# Patient Record
Sex: Male | Born: 1970 | Race: White | Hispanic: No | Marital: Single | Smoking: Former smoker
Health system: Southern US, Community
[De-identification: ages and names within clinical notes are randomized; demographics above are authoritative.]

## PROBLEM LIST (undated history)

## (undated) DIAGNOSIS — T4145XA Adverse effect of unspecified anesthetic, initial encounter: Secondary | ICD-10-CM

## (undated) DIAGNOSIS — T8859XA Other complications of anesthesia, initial encounter: Secondary | ICD-10-CM

## (undated) DIAGNOSIS — M1990 Unspecified osteoarthritis, unspecified site: Secondary | ICD-10-CM

## (undated) DIAGNOSIS — J45909 Unspecified asthma, uncomplicated: Secondary | ICD-10-CM

## (undated) DIAGNOSIS — D649 Anemia, unspecified: Secondary | ICD-10-CM

## (undated) DIAGNOSIS — K219 Gastro-esophageal reflux disease without esophagitis: Secondary | ICD-10-CM

## (undated) DIAGNOSIS — F4310 Post-traumatic stress disorder, unspecified: Secondary | ICD-10-CM

## (undated) HISTORY — PX: KNEE SURGERY: SHX244

## (undated) HISTORY — PX: OTHER SURGICAL HISTORY: SHX169

---

## 1999-10-31 DIAGNOSIS — D649 Anemia, unspecified: Secondary | ICD-10-CM

## 1999-10-31 HISTORY — PX: COSMETIC SURGERY: SHX468

## 1999-10-31 HISTORY — DX: Anemia, unspecified: D64.9

## 2000-02-04 ENCOUNTER — Encounter: Payer: Self-pay | Admitting: Specialist

## 2000-02-04 ENCOUNTER — Inpatient Hospital Stay (HOSPITAL_COMMUNITY): Admission: EM | Admit: 2000-02-04 | Discharge: 2000-02-05 | Payer: Self-pay | Admitting: Emergency Medicine

## 2000-04-10 ENCOUNTER — Encounter: Payer: Self-pay | Admitting: Internal Medicine

## 2000-04-10 ENCOUNTER — Encounter: Admission: RE | Admit: 2000-04-10 | Discharge: 2000-04-10 | Payer: Self-pay | Admitting: Internal Medicine

## 2000-06-08 ENCOUNTER — Other Ambulatory Visit: Admission: RE | Admit: 2000-06-08 | Discharge: 2000-06-08 | Payer: Self-pay | Admitting: Orthopedic Surgery

## 2001-12-18 ENCOUNTER — Ambulatory Visit (HOSPITAL_BASED_OUTPATIENT_CLINIC_OR_DEPARTMENT_OTHER): Admission: RE | Admit: 2001-12-18 | Discharge: 2001-12-18 | Payer: Self-pay | Admitting: Orthopedic Surgery

## 2001-12-18 ENCOUNTER — Encounter (INDEPENDENT_AMBULATORY_CARE_PROVIDER_SITE_OTHER): Payer: Self-pay | Admitting: *Deleted

## 2002-05-03 ENCOUNTER — Emergency Department (HOSPITAL_COMMUNITY): Admission: EM | Admit: 2002-05-03 | Discharge: 2002-05-04 | Payer: Self-pay | Admitting: Emergency Medicine

## 2006-03-26 ENCOUNTER — Emergency Department (HOSPITAL_COMMUNITY): Admission: EM | Admit: 2006-03-26 | Discharge: 2006-03-26 | Payer: Self-pay | Admitting: Emergency Medicine

## 2007-02-21 ENCOUNTER — Encounter: Admission: RE | Admit: 2007-02-21 | Discharge: 2007-02-21 | Payer: Self-pay | Admitting: Family Medicine

## 2007-09-16 ENCOUNTER — Encounter: Admission: RE | Admit: 2007-09-16 | Discharge: 2007-09-16 | Payer: Self-pay | Admitting: Internal Medicine

## 2010-07-07 ENCOUNTER — Encounter: Admission: RE | Admit: 2010-07-07 | Discharge: 2010-07-07 | Payer: Self-pay | Admitting: Internal Medicine

## 2010-07-22 ENCOUNTER — Encounter: Admission: RE | Admit: 2010-07-22 | Discharge: 2010-07-22 | Payer: Self-pay | Admitting: Gastroenterology
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
## 2011-03-17 NOTE — Op Note (Signed)
[place]. [facility]  Patient:    Kenneth Lara, Kenneth Lara Visit Number: [id number] MRN: [id number]          Service Type: Attending:  Artist Pais. Mina Marble, M.D. Dictated by:   Artist Pais Mina Marble, M.D. Proc. Date: 12/18/01                             Operative Report  PREOPERATIVE DIAGNOSIS: Recurrent left wrist dorsal ganglion cyst.  POSTOPERATIVE DIAGNOSIS: Recurrent left wrist dorsal ganglion cyst.  OPERATION/PROCEDURE: Excision of recurrent cyst of left wrist and posterior interosseous nerve neurectomy.  SURGEON: Artist Pais. Mina Marble, M.D.  ASSISTANT: Junius Roads. Ireton, P.A.C.  ANESTHESIA: General.  TOURNIQUET TIME: Twenty-eight minutes.  COMPLICATIONS: None.  DRAINS: None.  SPECIMENS: Two sent.  DESCRIPTION OF PROCEDURE: The patient was taken to the operating room and after the induction of adequate general anesthesia the left upper extremity was prepped and draped in the usual sterile fashion.  An Esmarch was used to exsanguinate the limb and the tourniquet then inflated to 250 mm Hg.  At this point in time a 3 cm transverse incision was made dorsally over the left wrist using his old scar.  The incision was taken down through the skin and subcutaneous tissue, with care to carefully identify and retract dorsal veins and branches of the superficial radial nerve.  At this point in time the interval between the third and fourth dorsal compartments was identified, and a recurrent cyst approximately 1 x 1.5 cm in diameter was dissected down to the dorsal capsule, and a small amount of the capsule as well as the cyst were excised in entirety.  At this point in time, proximal retraction of the skin flaps was undertaken, and the fourth dorsal compartment was carefully dissected down to the base, where the posterior interosseous nerve was identified.  It was transected distally, freed up proximally, and then resected and allowed to drop back into the  proximal wound.  The wound was then thoroughly irrigated.  Hemostasis was achieved with bipolar cautery. The dorsal capsule was repaired in pants-over-vest fashion using 4-0 Vicryl x1 horizontal mattress sutures, followed by closure of the skin with running 3-0 Prolene subcuticular stitch.  Steri-Strips, 4 x 4s, fluffs, and compressive dressing were applied, as well as a volar splint. The patient tolerated the procedure well and went to the recovery room in stable fashion. Dictated by:   Artist Pais Mina Marble, M.D. Attending:  Artist Pais Mina Marble, M.D. DD:  12/18/01 TD:  12/18/01 Job: [id number]

## 2013-09-05 ENCOUNTER — Other Ambulatory Visit: Payer: Self-pay | Admitting: Geriatric Medicine

## 2013-09-05 ENCOUNTER — Ambulatory Visit
Admission: RE | Admit: 2013-09-05 | Discharge: 2013-09-05 | Disposition: A | Discharge status: Home / Self Care | Payer: BC Managed Care – PPO | Source: Ambulatory Visit | Attending: Geriatric Medicine | Admitting: Geriatric Medicine

## 2013-09-05 DIAGNOSIS — J40 Bronchitis, not specified as acute or chronic: Secondary | ICD-10-CM

## 2014-03-30 HISTORY — PX: BICEPS TENDON REPAIR: SHX566

## 2014-06-04 ENCOUNTER — Other Ambulatory Visit: Payer: Self-pay | Admitting: Gastroenterology

## 2014-08-03 ENCOUNTER — Other Ambulatory Visit: Payer: Self-pay | Admitting: Gastroenterology

## 2014-08-04 ENCOUNTER — Encounter (HOSPITAL_COMMUNITY): Payer: Self-pay | Admitting: Pharmacy Technician

## 2014-08-05 ENCOUNTER — Encounter (HOSPITAL_COMMUNITY): Payer: Self-pay | Admitting: *Deleted

## 2014-08-24 ENCOUNTER — Ambulatory Visit (HOSPITAL_COMMUNITY): Payer: BC Managed Care – PPO | Admitting: Anesthesiology

## 2014-08-24 ENCOUNTER — Encounter (HOSPITAL_COMMUNITY): Payer: Self-pay | Admitting: *Deleted

## 2014-08-24 ENCOUNTER — Encounter (HOSPITAL_COMMUNITY)
Admission: RE | Disposition: A | Discharge status: Home / Self Care | Payer: Self-pay | Source: Ambulatory Visit | Attending: Gastroenterology

## 2014-08-24 ENCOUNTER — Ambulatory Visit (HOSPITAL_COMMUNITY)
Admission: RE | Admit: 2014-08-24 | Discharge: 2014-08-24 | Disposition: A | Discharge status: Home / Self Care | Payer: BC Managed Care – PPO | Source: Ambulatory Visit | Attending: Gastroenterology | Admitting: Gastroenterology

## 2014-08-24 ENCOUNTER — Encounter (HOSPITAL_COMMUNITY): Payer: BC Managed Care – PPO | Admitting: Anesthesiology

## 2014-08-24 DIAGNOSIS — D125 Benign neoplasm of sigmoid colon: Secondary | ICD-10-CM | POA: Insufficient documentation

## 2014-08-24 DIAGNOSIS — F329 Major depressive disorder, single episode, unspecified: Secondary | ICD-10-CM | POA: Diagnosis not present

## 2014-08-24 DIAGNOSIS — Z800 Family history of malignant neoplasm of digestive organs: Secondary | ICD-10-CM | POA: Diagnosis not present

## 2014-08-24 DIAGNOSIS — Z86010 Personal history of colonic polyps: Secondary | ICD-10-CM | POA: Diagnosis not present

## 2014-08-24 DIAGNOSIS — Z882 Allergy status to sulfonamides status: Secondary | ICD-10-CM | POA: Diagnosis not present

## 2014-08-24 DIAGNOSIS — F4310 Post-traumatic stress disorder, unspecified: Secondary | ICD-10-CM | POA: Insufficient documentation

## 2014-08-24 DIAGNOSIS — Z1211 Encounter for screening for malignant neoplasm of colon: Secondary | ICD-10-CM | POA: Diagnosis present

## 2014-08-24 DIAGNOSIS — J309 Allergic rhinitis, unspecified: Secondary | ICD-10-CM | POA: Insufficient documentation

## 2014-08-24 HISTORY — DX: Gastro-esophageal reflux disease without esophagitis: K21.9

## 2014-08-24 HISTORY — DX: Anemia, unspecified: D64.9

## 2014-08-24 HISTORY — DX: Unspecified asthma, uncomplicated: J45.909

## 2014-08-24 HISTORY — DX: Post-traumatic stress disorder, unspecified: F43.10

## 2014-08-24 HISTORY — PX: COLONOSCOPY WITH PROPOFOL: SHX5780

## 2014-08-24 HISTORY — DX: Unspecified osteoarthritis, unspecified site: M19.90

## 2014-08-24 HISTORY — DX: Other complications of anesthesia, initial encounter: T88.59XA

## 2014-08-24 HISTORY — DX: Adverse effect of unspecified anesthetic, initial encounter: T41.45XA

## 2014-08-24 SURGERY — COLONOSCOPY WITH PROPOFOL
Anesthesia: Monitor Anesthesia Care

## 2014-08-24 MED ORDER — SODIUM CHLORIDE 0.9 % IV SOLN: INTRAVENOUS | Status: DC

## 2014-08-24 MED ORDER — LIDOCAINE HCL (CARDIAC) 20 MG/ML IV SOLN
INTRAVENOUS | Status: AC
  Filled 2014-08-24: qty 5

## 2014-08-24 MED ORDER — LIDOCAINE HCL (CARDIAC) 20 MG/ML IV SOLN
INTRAVENOUS | Status: DC | PRN
  Administered 2014-08-24 (×2): 50 mg via INTRAVENOUS

## 2014-08-24 MED ORDER — LACTATED RINGERS IV SOLN
INTRAVENOUS | Status: DC
Start: 2014-08-24 — End: 2014-08-24
  Administered 2014-08-24: 500 mL via INTRAVENOUS

## 2014-08-24 MED ORDER — PROPOFOL 10 MG/ML IV BOLUS
INTRAVENOUS | Status: AC
  Filled 2014-08-24: qty 20

## 2014-08-24 MED ORDER — PROPOFOL INFUSION 10 MG/ML OPTIME
INTRAVENOUS | Status: DC | PRN
Start: 2014-08-24 — End: 2014-08-24
  Administered 2014-08-24: 140 ug/kg/min via INTRAVENOUS

## 2014-08-24 MED ORDER — PROPOFOL 10 MG/ML IV BOLUS
INTRAVENOUS | Status: DC | PRN
  Administered 2014-08-24: 100 mg via INTRAVENOUS
  Administered 2014-08-24: 50 mg via INTRAVENOUS

## 2014-08-24 SURGICAL SUPPLY — 22 items

## 2014-08-24 NOTE — Anesthesia Preprocedure Evaluation (Signed)
Anesthesia Evaluation  Patient identified by MRN, date of birth, ID band Patient awake    Reviewed: Allergy & Precautions, H&P , NPO status , Patient's Chart, lab work & pertinent test results  History of Anesthesia Complications (+) history of anesthetic complications  Airway Mallampati: II TM Distance: >3 FB Neck ROM: Full    Dental no notable dental hx.    Pulmonary asthma ,  breath sounds clear to auscultation  Pulmonary exam normal       Cardiovascular negative cardio ROS  Rhythm:Regular Rate:Normal     Neuro/Psych PSYCHIATRIC DISORDERS negative neurological ROS     GI/Hepatic Neg liver ROS, GERD-  ,  Endo/Other  negative endocrine ROS  Renal/GU negative Renal ROS     Musculoskeletal  (+) Arthritis -,   Abdominal   Peds  Hematology  (+) anemia ,   Anesthesia Other Findings   Reproductive/Obstetrics negative OB ROS                           Anesthesia Physical Anesthesia Plan  ASA: II  Anesthesia Plan: MAC   Post-op Pain Management:    Induction: Intravenous  Airway Management Planned:   Additional Equipment:   Intra-op Plan:   Post-operative Plan:   Informed Consent: I have reviewed the patients History and Physical, chart, labs and discussed the procedure including the risks, benefits and alternatives for the proposed anesthesia with the patient or authorized representative who has indicated his/her understanding and acceptance.   Dental advisory given  Plan Discussed with: CRNA  Anesthesia Plan Comments:         Anesthesia Quick Evaluation

## 2014-08-24 NOTE — Discharge Instructions (Signed)

## 2014-08-24 NOTE — H&P (Signed)
  Procedure: Surveillance colonoscopy. Colonoscopy with removal of a 12 mm tubulovillous adenomatous descending colon polyp performed on 07/11/2010. The patient was inadequately sedated during his first colonoscopy with versed and fentanyl. Mother diagnosed with colon cancer in her mid 33s.  History: The patient is a 43 year old male born 1971-01-07. His mother was diagnosed with colon cancer in her mid 14s. He underwent a colonoscopy on 07/11/2010 with removal of a 12 mm tubulovillous adenomatous descending colon polyp. He is scheduled to undergo a surveillance colonoscopy today.  Medication allergies: Sulfa  Past medical history: Right shoulder surgery. Right knee surgery. Allergic rhinitis. Posttraumatic stress disorder. Depression. Personal history of tubulovillous adenomatous colon polyp removed colonoscopically. Allergic rhinitis.  Family history: Mother diagnosed with colon cancer in her mid 24s.  Exam: The patient is alert and lying comfortably on the endoscopy stretcher. Lungs are clear to auscultation. Cardiac exam reveals a regular rhythm. Abdomen is soft and nontender to palpation.  Plan: Proceed with surveillance colonoscopy using propofol sedation.

## 2014-08-24 NOTE — Transfer of Care (Signed)
Immediate Anesthesia Transfer of Care Note  Patient: Kenneth Lara  Procedure(s) Performed: Procedure(s): COLONOSCOPY WITH PROPOFOL (N/A)  Patient Location: PACU  Anesthesia Type:MAC  Level of Consciousness: awake, alert  and oriented  Airway & Oxygen Therapy: Patient Spontanous Breathing and Patient connected to face mask oxygen  Post-op Assessment: Report given to PACU RN and Post -op Vital signs reviewed and stable  Post vital signs: Reviewed and stable  Complications: No apparent anesthesia complications

## 2014-08-24 NOTE — Anesthesia Postprocedure Evaluation (Signed)
Anesthesia Post Note  Patient: Kenneth Lara  Procedure(s) Performed: Procedure(s) (LRB): COLONOSCOPY WITH PROPOFOL (N/A)  Anesthesia type: MAC  Patient location: PACU  Post pain: Pain level controlled  Post assessment: Post-op Vital signs reviewed  Last Vitals: BP 130/88  Pulse 45  Temp(Src) 36.5 C (Oral)  Resp 16  Ht 5\' 11"  (1.803 m)  Wt 177 lb (80.287 kg)  BMI 24.70 kg/m2  SpO2 100%  Post vital signs: Reviewed  Level of consciousness: awake  Complications: No apparent anesthesia complications

## 2014-08-24 NOTE — Op Note (Signed)
Procedure: Surveillance colonoscopy. Colonoscopy with removal of a 12 mm tubulovillous adenomatous descending colon polyp performed on 07/11/2010. Mother diagnosed with colon cancer in her mid 70s.  Endoscopist: Earle Gell  Premedication: Propofol administered by anesthesia  Procedure: The patient was placed in the left lateral decubitus position. Anal inspection and digital rectal exam were normal. The Pentax pediatric colonoscope was introduced into the rectum and advanced to the cecum. A normal-appearing appendiceal orifice was identified. A normal-appearing ileocecal valve was intubated and the terminal ileum inspected. Colonic preparation for the exam today was good.  Rectum. Normal. Retroflex view of the distal rectum normal  Sigmoid colon. A 5 mm sessile polyp was removed with the cold snare at 40 cm from the anal verge.  Descending colon. Normal  Splenic flexure. Normal  Transverse colon. Normal  Hepatic flexure. Normal  Ascending colon. Normal  Cecum and ileocecal valve. Normal  Terminal ileum. Normal  Assessment: A small sigmoid colon polyp was removed with the cold snare; otherwise normal surveillance colonoscopy  Recommendation: Schedule surveillance colonoscopy in 5 years

## 2014-08-25 ENCOUNTER — Encounter (HOSPITAL_COMMUNITY): Payer: Self-pay | Admitting: Gastroenterology

## 2017-02-09 ENCOUNTER — Other Ambulatory Visit: Payer: Self-pay | Admitting: Orthopedic Surgery

## 2017-02-09 DIAGNOSIS — M67912 Unspecified disorder of synovium and tendon, left shoulder: Secondary | ICD-10-CM

## 2017-02-16 ENCOUNTER — Other Ambulatory Visit: Payer: Self-pay

## 2017-02-16 ENCOUNTER — Ambulatory Visit
Admission: RE | Admit: 2017-02-16 | Discharge: 2017-02-16 | Disposition: A | Discharge status: Home / Self Care | Payer: BLUE CROSS/BLUE SHIELD | Source: Ambulatory Visit | Attending: Orthopedic Surgery | Admitting: Orthopedic Surgery

## 2017-02-16 DIAGNOSIS — M67912 Unspecified disorder of synovium and tendon, left shoulder: Secondary | ICD-10-CM

## 2017-02-24 ENCOUNTER — Other Ambulatory Visit: Payer: Self-pay

## 2017-03-07 ENCOUNTER — Ambulatory Visit (INDEPENDENT_AMBULATORY_CARE_PROVIDER_SITE_OTHER): Payer: BLUE CROSS/BLUE SHIELD | Admitting: Physician Assistant

## 2017-03-07 ENCOUNTER — Encounter: Payer: Self-pay | Admitting: Physician Assistant

## 2017-03-07 ENCOUNTER — Ambulatory Visit: Payer: Self-pay | Admitting: Family Medicine

## 2017-03-07 ENCOUNTER — Ambulatory Visit (INDEPENDENT_AMBULATORY_CARE_PROVIDER_SITE_OTHER): Payer: BLUE CROSS/BLUE SHIELD

## 2017-03-07 VITALS — BP 142/94 | HR 68 | Temp 98.9°F | Resp 18 | Ht 71.0 in | Wt 177.0 lb

## 2017-03-07 DIAGNOSIS — M25522 Pain in left elbow: Secondary | ICD-10-CM

## 2017-03-07 DIAGNOSIS — S51012A Laceration without foreign body of left elbow, initial encounter: Secondary | ICD-10-CM | POA: Diagnosis not present

## 2017-03-07 MED ORDER — HYDROCODONE-ACETAMINOPHEN 7.5-325 MG PO TABS: 1.0000 | ORAL_TABLET | Freq: Four times a day (QID) | ORAL | 0 refills | Status: AC | PRN
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
## 2017-03-07 NOTE — Patient Instructions (Addendum)
  WOUND CARE Please return in 7 days to have your stitches/staples removed or sooner if you have concerns. . Keep area clean and dry with dressing in place for 24 hours. . After 24 hours, remove bandage and wash wound gently with mild soap and warm water. When skin is dry, reapply a new bandage. Once wound is no longer draining, may leave wound open to air. . Continue daily cleansing with soap and water until stitches/staples are removed. . Do not apply any ointments or creams to the wound while stitches/staples are in place, as this may cause delayed healing. . Notify the office if you experience any of the following signs of infection: Swelling, redness, pus drainage, streaking, fever >101.0 F . Notify the office if you experience excessive bleeding that does not stop after 15-20 minutes of constant, firm pressure.  Thank you for coming in today. I hope you feel we met your needs.  Feel free to call [facility] if you have any questions or further requests.  Please consider signing up for MyChart if you do not already have it, as this is a great way to communicate with me.  Best,  Whitney McVey, PA-C    IF you received an x-ray today, you will receive an invoice from [facility]. Please contact [facility] at [phone] with questions or concerns regarding your invoice.   IF you received labwork today, you will receive an invoice from [facility]. Please contact [facility] at [phone] with questions or concerns regarding your invoice.   Our billing staff will not be able to assist you with questions regarding bills from these companies.  You will be contacted with the lab results as soon as they are available. The fastest way to get your results is to activate your My Chart account. Instructions are located on the last page of this paperwork. If you have not heard from us regarding the results in 2 weeks, please contact this office.      

## 2017-03-08 NOTE — Progress Notes (Signed)
Pt called in c/o throbbing left elbow pain s/p injury. Advised script is at front desk for pick up

## 2017-04-01 ENCOUNTER — Encounter (HOSPITAL_BASED_OUTPATIENT_CLINIC_OR_DEPARTMENT_OTHER): Payer: Self-pay

## 2017-04-01 ENCOUNTER — Emergency Department (HOSPITAL_BASED_OUTPATIENT_CLINIC_OR_DEPARTMENT_OTHER): Payer: BLUE CROSS/BLUE SHIELD

## 2017-04-01 ENCOUNTER — Emergency Department (HOSPITAL_BASED_OUTPATIENT_CLINIC_OR_DEPARTMENT_OTHER)
Admission: EM | Admit: 2017-04-01 | Discharge: 2017-04-01 | Disposition: A | Discharge status: Home / Self Care | Payer: BLUE CROSS/BLUE SHIELD | Attending: Emergency Medicine | Admitting: Emergency Medicine

## 2017-04-01 DIAGNOSIS — Z87891 Personal history of nicotine dependence: Secondary | ICD-10-CM | POA: Diagnosis not present

## 2017-04-01 DIAGNOSIS — R519 Headache, unspecified: Secondary | ICD-10-CM

## 2017-04-01 DIAGNOSIS — R51 Headache: Secondary | ICD-10-CM | POA: Diagnosis not present

## 2017-04-01 DIAGNOSIS — F419 Anxiety disorder, unspecified: Secondary | ICD-10-CM | POA: Diagnosis not present

## 2017-04-01 DIAGNOSIS — R079 Chest pain, unspecified: Secondary | ICD-10-CM | POA: Diagnosis present

## 2017-04-01 DIAGNOSIS — R0789 Other chest pain: Secondary | ICD-10-CM | POA: Insufficient documentation

## 2017-04-01 DIAGNOSIS — J45909 Unspecified asthma, uncomplicated: Secondary | ICD-10-CM | POA: Insufficient documentation

## 2017-04-01 LAB — COMPREHENSIVE METABOLIC PANEL
ALK PHOS: 42 U/L (ref 38–126)
ALT: 15 U/L — ABNORMAL LOW (ref 17–63)
ANION GAP: 8 (ref 5–15)
AST: 19 U/L (ref 15–41)
Albumin: 4.4 g/dL (ref 3.5–5.0)
BUN: 13 mg/dL (ref 6–20)
CALCIUM: 9.4 mg/dL (ref 8.9–10.3)
CO2: 26 mmol/L (ref 22–32)
Chloride: 102 mmol/L (ref 101–111)
Creatinine, Ser: 1.11 mg/dL (ref 0.61–1.24)
GFR calc Af Amer: >60 mL/min (ref >60)
GFR calc non Af Amer: >60 mL/min (ref >60)
Glucose, Bld: 101 mg/dL — ABNORMAL HIGH (ref 65–99)
Potassium: 3.9 mmol/L (ref 3.5–5.1)
SODIUM: 136 mmol/L (ref 135–145)
Total Bilirubin: 0.6 mg/dL (ref 0.3–1.2)
Total Protein: 7.3 g/dL (ref 6.5–8.1)

## 2017-04-01 LAB — CBC WITH DIFFERENTIAL/PLATELET
BASOS PCT: 0 %
Basophils Absolute: 0 10*3/uL (ref 0.0–0.1)
Eosinophils Absolute: 0 10*3/uL (ref 0.0–0.7)
Eosinophils Relative: 1 %
HCT: 45.7 % (ref 39.0–52.0)
Hemoglobin: 15.5 g/dL (ref 13.0–17.0)
Lymphocytes Relative: 25 %
Lymphs Abs: 1.2 10*3/uL (ref 0.7–4.0)
MCH: 31.8 pg (ref 26.0–34.0)
MCHC: 33.9 g/dL (ref 30.0–36.0)
MCV: 93.6 fL (ref 78.0–100.0)
MONO ABS: 0.5 10*3/uL (ref 0.1–1.0)
MONOS PCT: 11 %
NEUTROS ABS: 3 10*3/uL (ref 1.7–7.7)
Neutrophils Relative %: 63 %
Platelets: 177 10*3/uL (ref 150–400)
RBC: 4.88 MIL/uL (ref 4.22–5.81)
RDW: 14.4 % (ref 11.5–15.5)
WBC: 4.8 10*3/uL (ref 4.0–10.5)

## 2017-04-01 LAB — TROPONIN I: Troponin I: <0.03 ng/mL (ref <0.03)

## 2017-04-01 MED ORDER — METOCLOPRAMIDE HCL 5 MG/ML IJ SOLN
10.0000 mg | Freq: Once | INTRAMUSCULAR | Status: AC
  Administered 2017-04-01: 10 mg via INTRAVENOUS
  Filled 2017-04-01: qty 2

## 2017-04-01 MED ORDER — DIPHENHYDRAMINE HCL 50 MG/ML IJ SOLN
25.0000 mg | Freq: Once | INTRAMUSCULAR | Status: AC
  Administered 2017-04-01: 25 mg via INTRAVENOUS
  Filled 2017-04-01: qty 1

## 2017-04-01 MED ORDER — KETOROLAC TROMETHAMINE 30 MG/ML IJ SOLN
30.0000 mg | Freq: Once | INTRAMUSCULAR | Status: AC
  Administered 2017-04-01: 30 mg via INTRAVENOUS
  Filled 2017-04-01: qty 1

## 2017-04-01 MED ORDER — SODIUM CHLORIDE 0.9 % IV BOLUS (SEPSIS)
1000.0000 mL | Freq: Once | INTRAVENOUS | Status: AC
  Administered 2017-04-01: 1000 mL via INTRAVENOUS

## 2017-04-01 NOTE — ED Notes (Signed)
Patient reports dizziness has subsided at present.

## 2017-04-01 NOTE — ED Notes (Signed)
ED Provider at bedside. 

## 2017-04-01 NOTE — ED Notes (Addendum)
Per father patient also reports pain behind left eye.  Reports he has had a headache from this.  EDP made aware.

## 2017-04-01 NOTE — ED Notes (Signed)
Patient reports regular HR around 60 BPM.  Patient currently between 47-52.  Reports that he has not felt good for the past 3 days and states chest pain worsens throughout the day.

## 2017-04-01 NOTE — ED Notes (Signed)
Patient was given a soda to drink and family member is bring food for the patient.

## 2017-04-01 NOTE — Discharge Instructions (Signed)
Return to the ED with any concerns including difficulty breathing, fainting, worsening pain, vomiting and not able to keep down liquids, weakness of arms or legs, changes in vision or speech, thoughts or feelings of homicide or suicide, decreased level of alertness/lethargy, or any other alarming symptoms
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
## 2017-04-01 NOTE — ED Triage Notes (Signed)
Pt reports left sided chest pain for three days, radiation to left neck, left head with pain behind left eye ball, feels like a pounding pressure, associated shortness of breath that he attributes to asthma. PT assumes this is anxiety.

## 2018-04-22 ENCOUNTER — Other Ambulatory Visit: Payer: Self-pay | Admitting: Nurse Practitioner

## 2018-04-22 DIAGNOSIS — R1011 Right upper quadrant pain: Secondary | ICD-10-CM

## 2018-04-24 ENCOUNTER — Other Ambulatory Visit: Payer: Self-pay | Admitting: Nurse Practitioner

## 2018-04-24 ENCOUNTER — Ambulatory Visit
Admission: RE | Admit: 2018-04-24 | Discharge: 2018-04-24 | Disposition: A | Discharge status: Home / Self Care | Payer: BLUE CROSS/BLUE SHIELD | Source: Ambulatory Visit | Attending: Nurse Practitioner | Admitting: Nurse Practitioner

## 2018-04-24 DIAGNOSIS — R1011 Right upper quadrant pain: Secondary | ICD-10-CM

## 2019-02-10 IMAGING — DX DG CHEST 2V
2 series · 2 of 2 positions shown · non-contrast
Comparison: 09/05/2013 chest radiograph

CLINICAL DATA: Left chest pain for 3 days.

EXAM:
CHEST  2 VIEW

[chest pa]
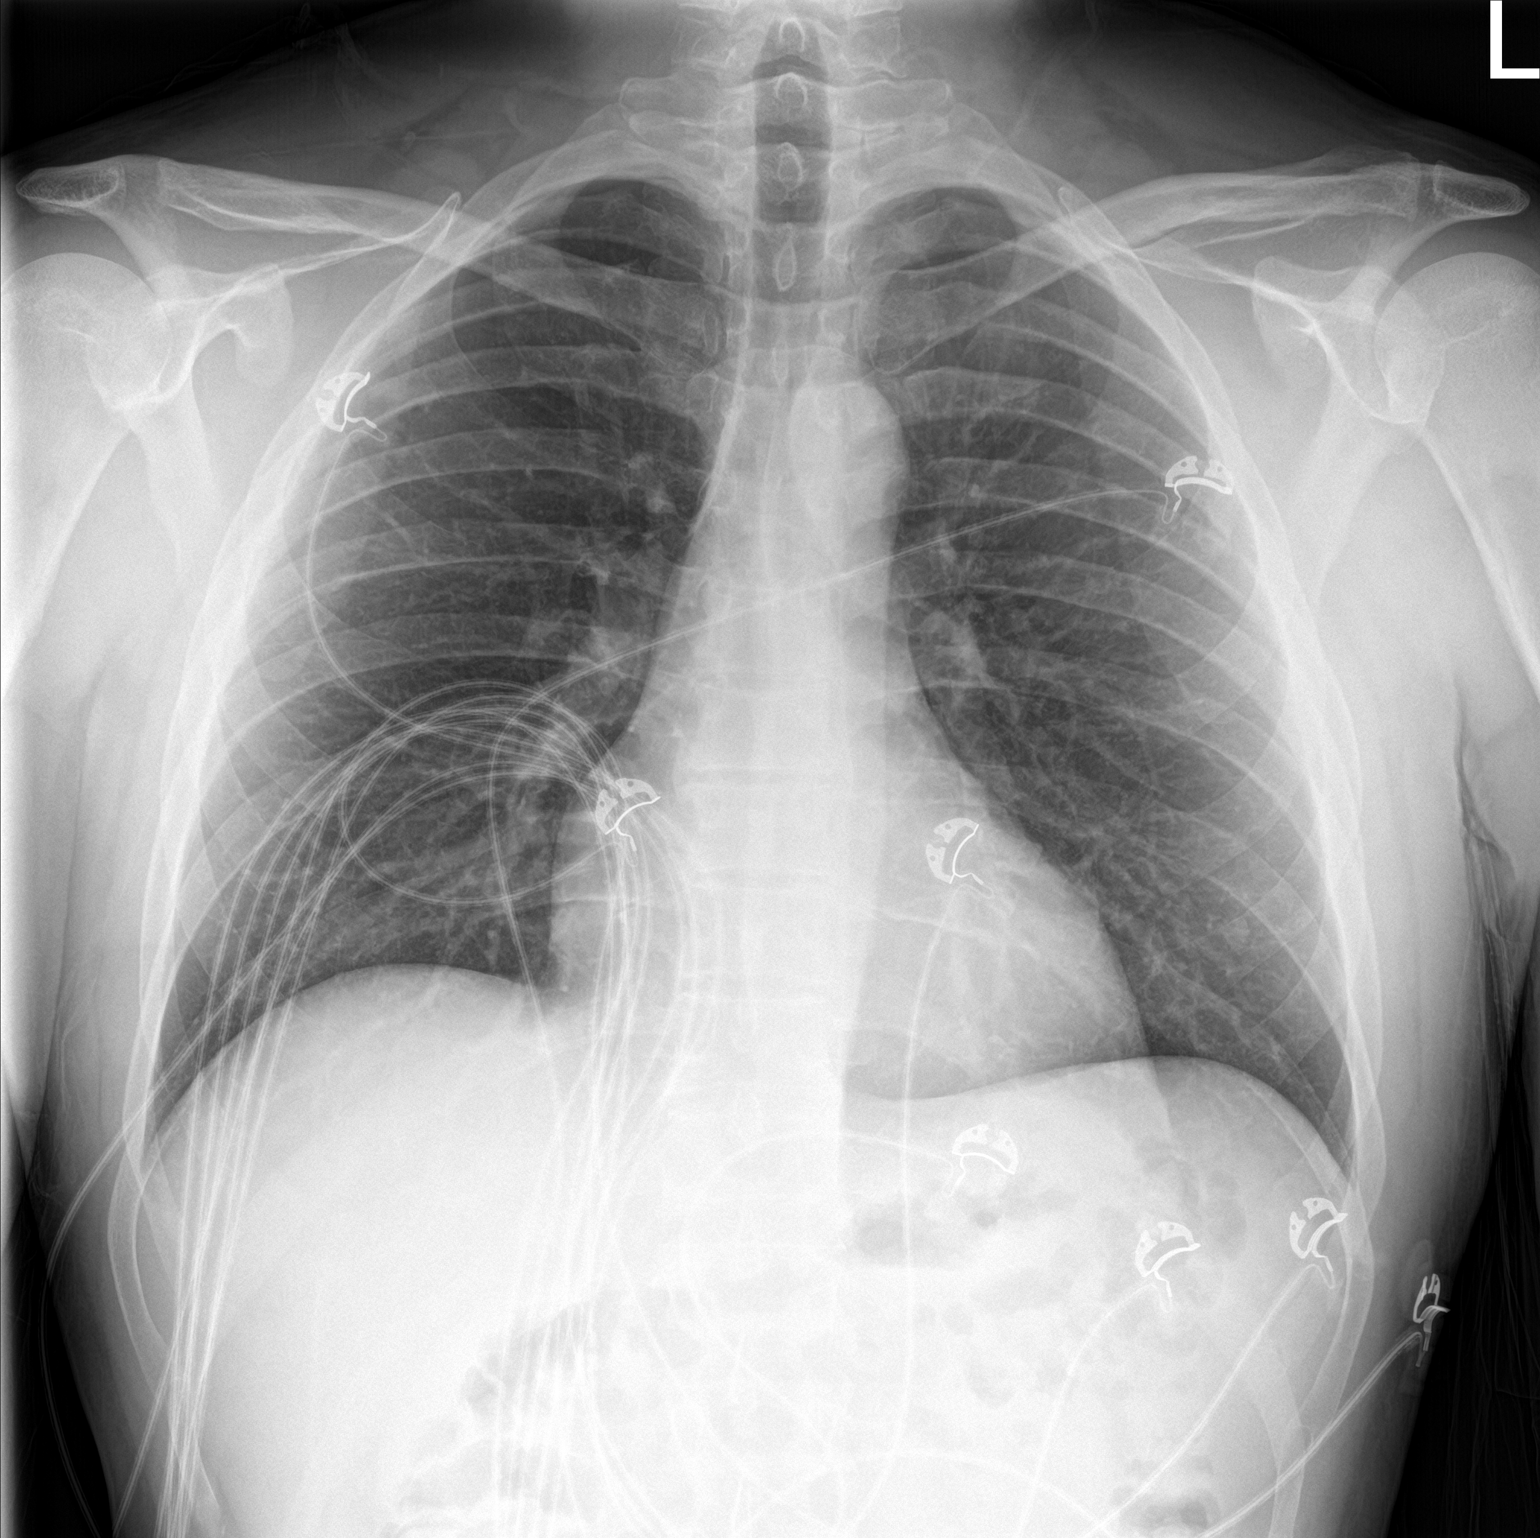

[chest lat]
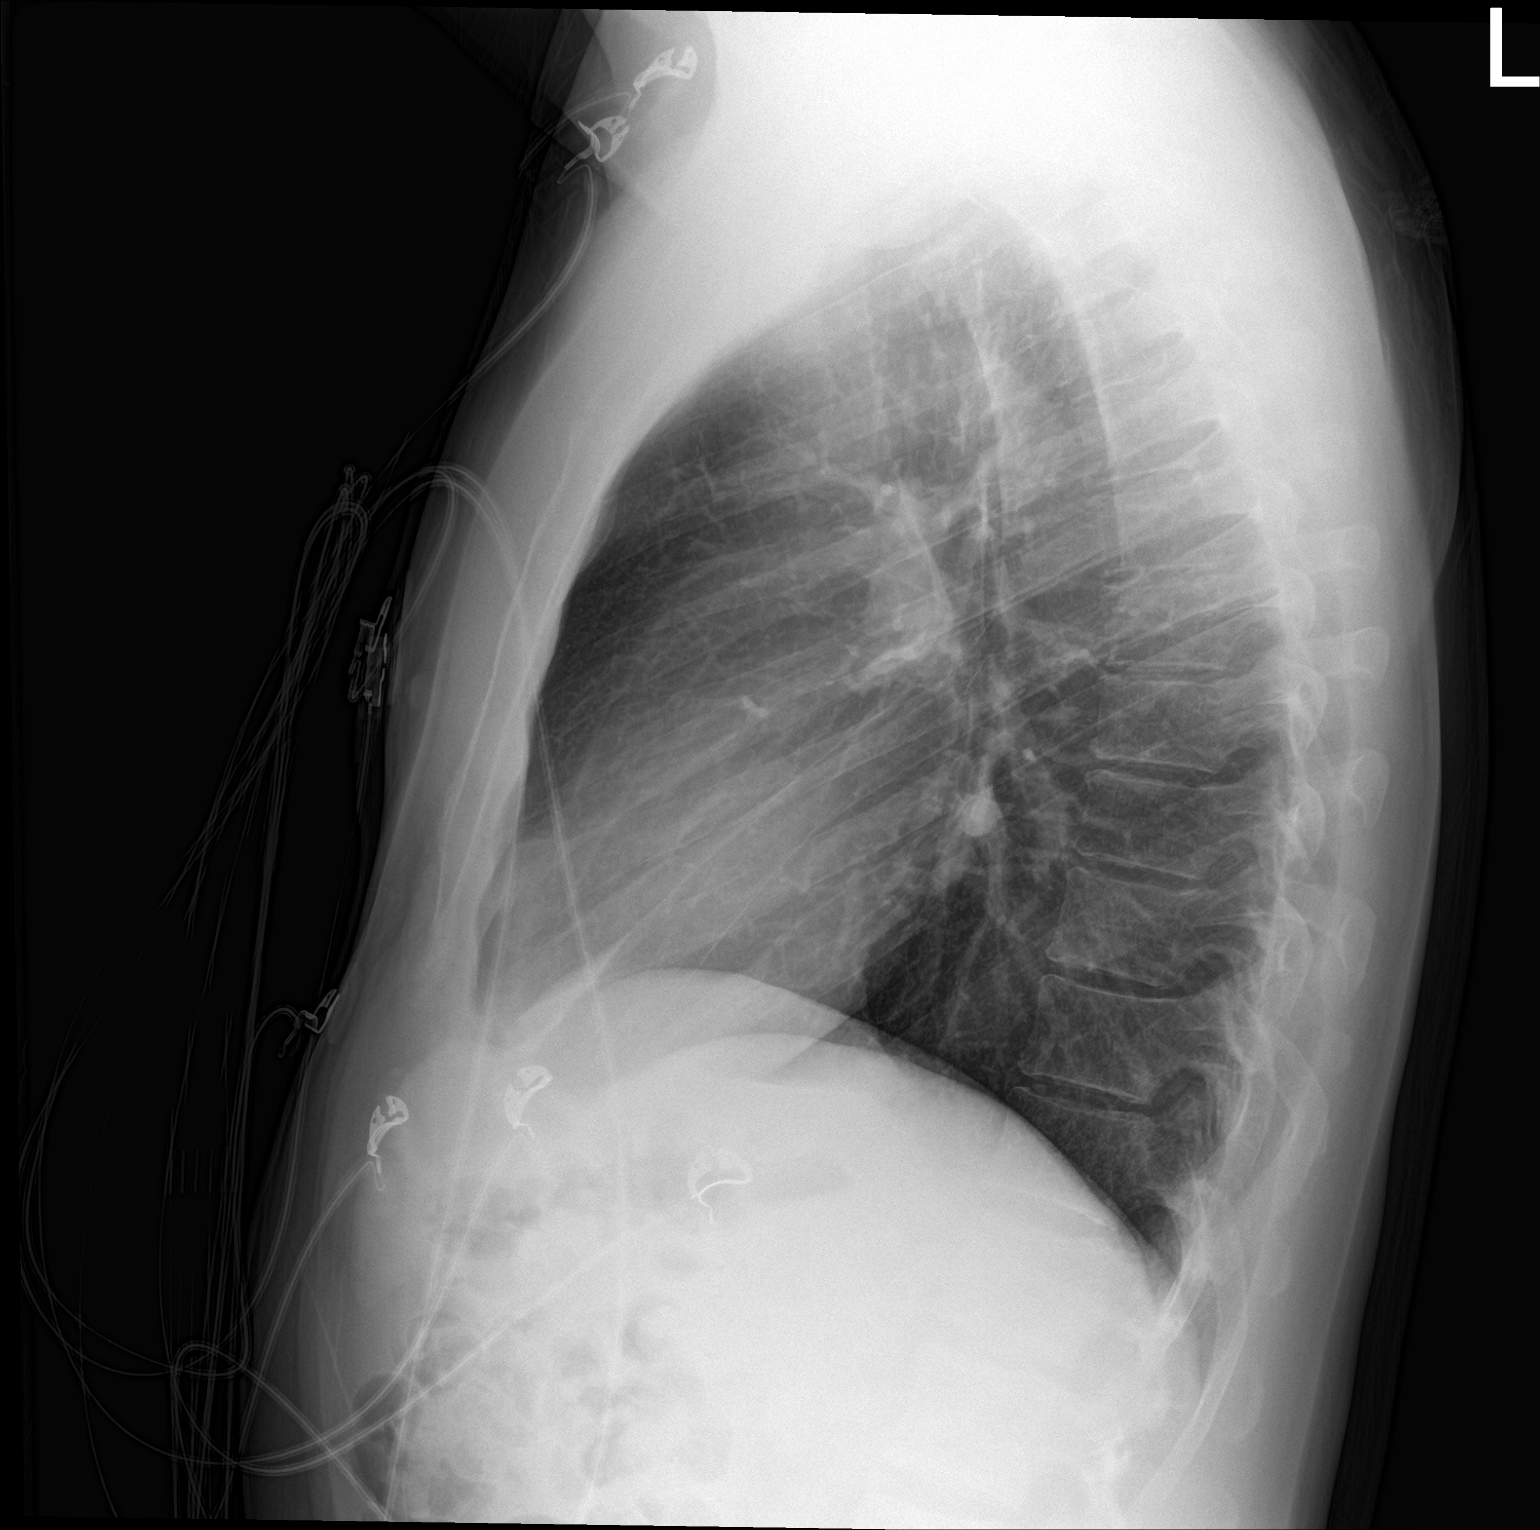

[2 of 2 positions shown; findings below may reference images not displayed]

FINDINGS: The cardiomediastinal silhouette is unremarkable.

There is no evidence of focal airspace disease, pulmonary edema,
suspicious pulmonary nodule/mass, pleural effusion, or pneumothorax.
No acute bony abnormalities are identified.
IMPRESSION: No active cardiopulmonary disease.

## 2019-07-04 IMAGING — US US ABDOMEN COMPLETE
1 series · 14 of 25 positions shown · non-contrast
Comparison: 07/07/2010 abdominal CT

CLINICAL DATA: Right upper quadrant pain for 2 weeks

EXAM:
ABDOMEN ULTRASOUND COMPLETE

[Series 1: us abdomen complete · 0.19mm/px · 14 of 75 slices shown]
[im 1/75]
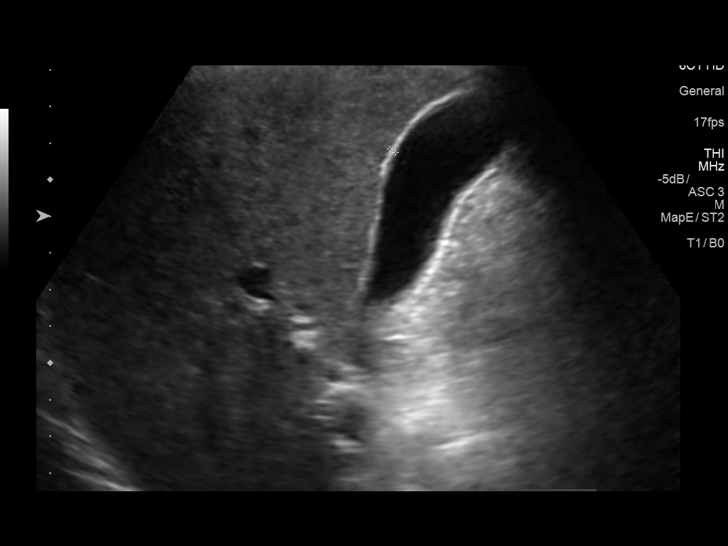
[im 7/75]
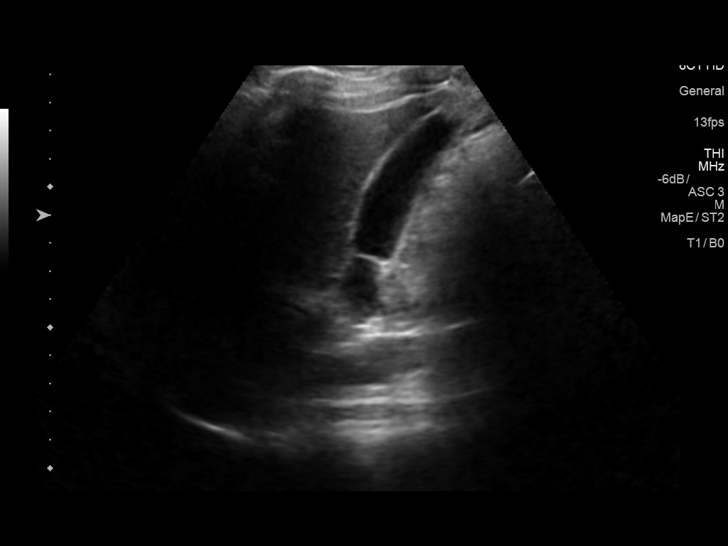
[im 13/75]
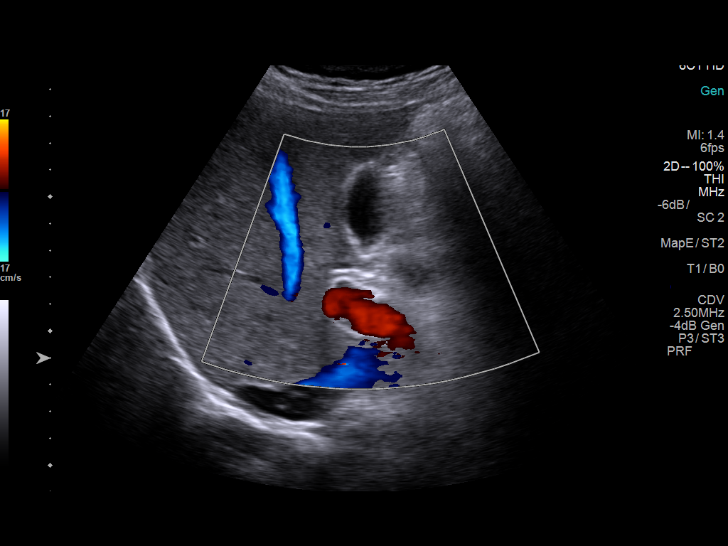
[im 19/75]
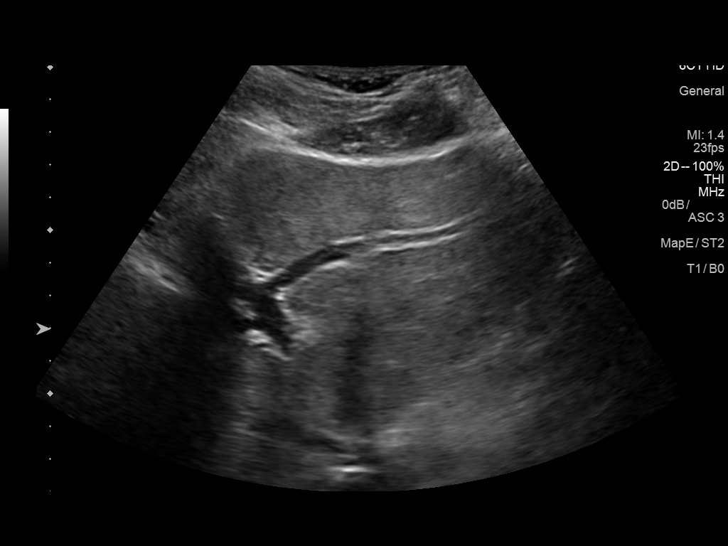
[im 25/75]
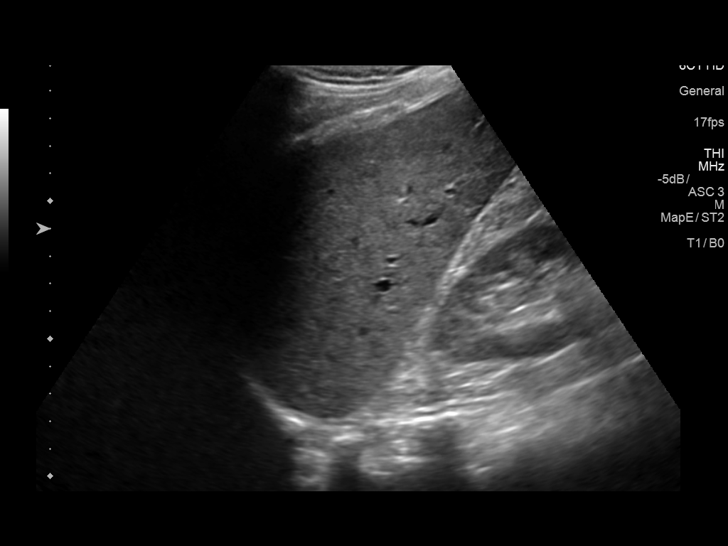
[im 28/75]
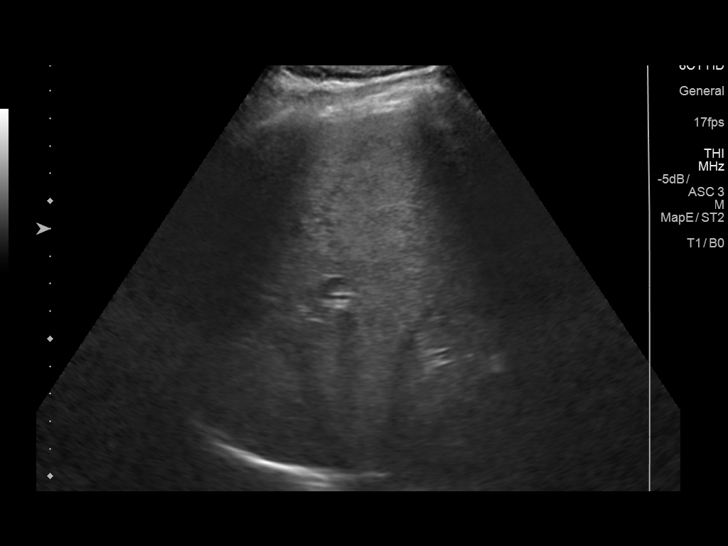
[im 34/75]
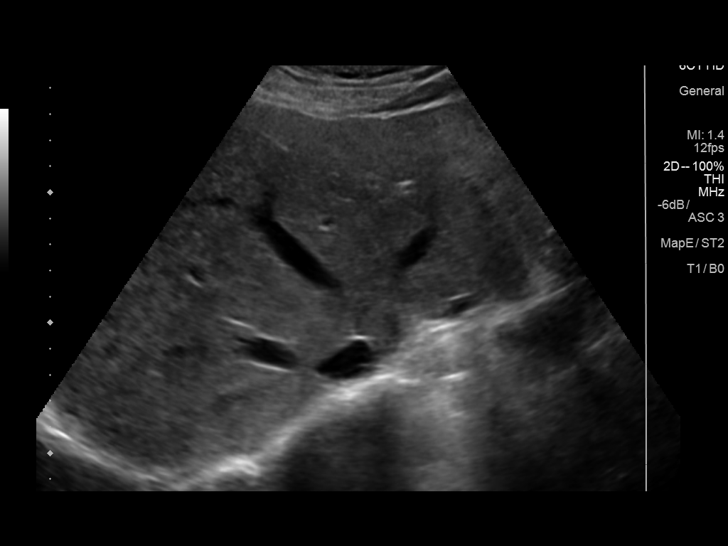
[im 41/75]
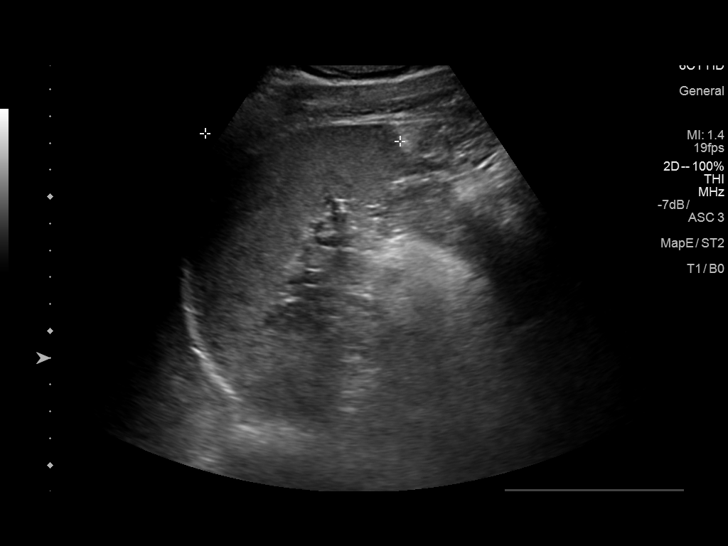
[im 47/75]
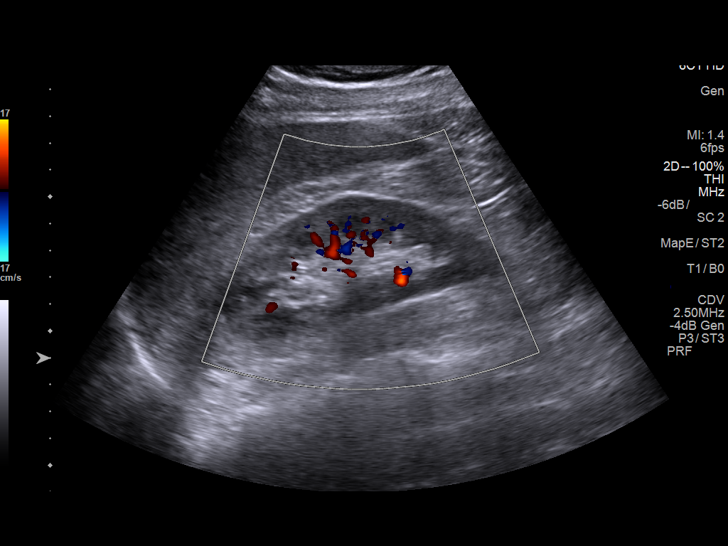
[im 50/75]
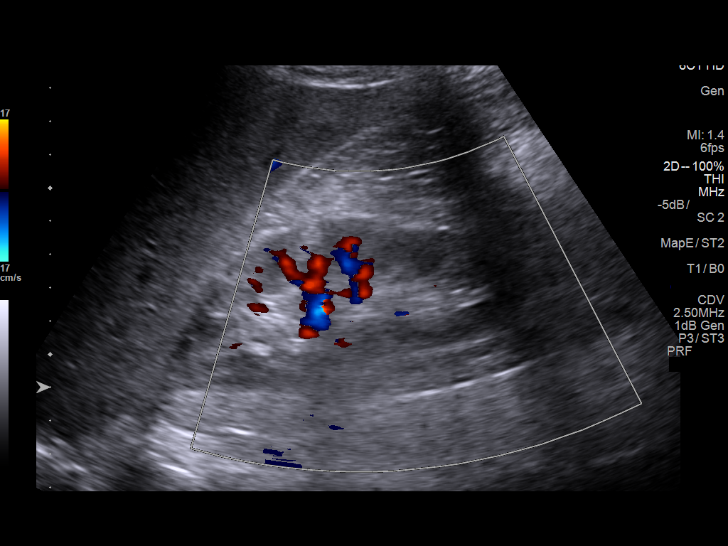
[im 56/75]
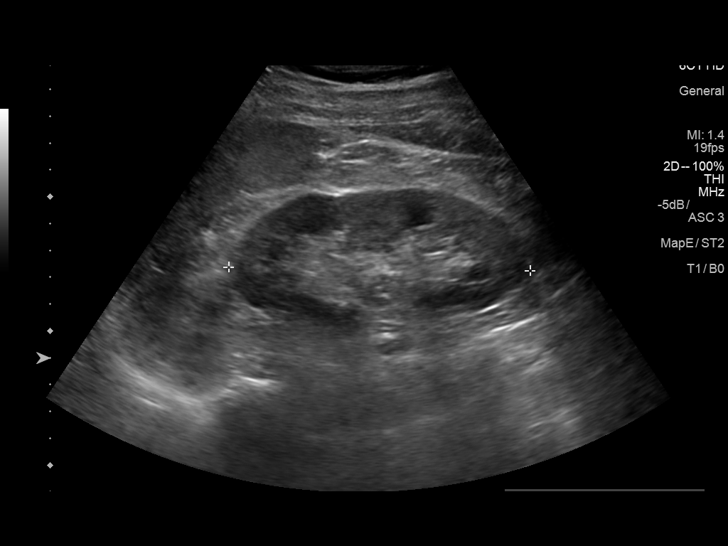
[im 62/75]
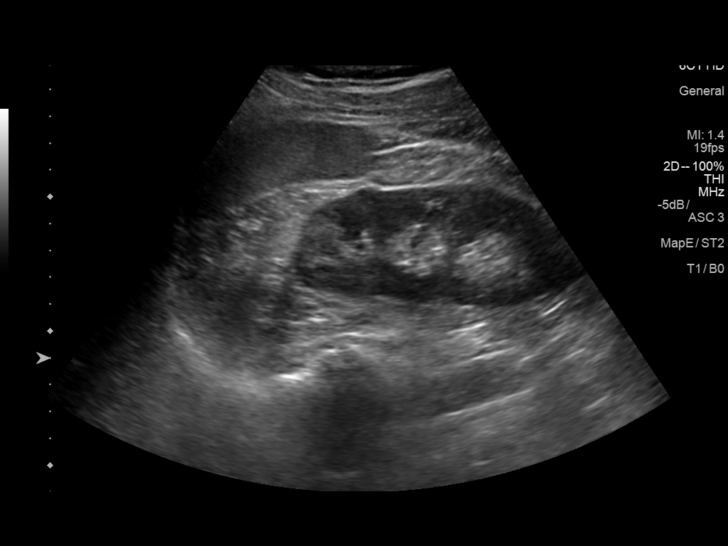
[im 68/75]
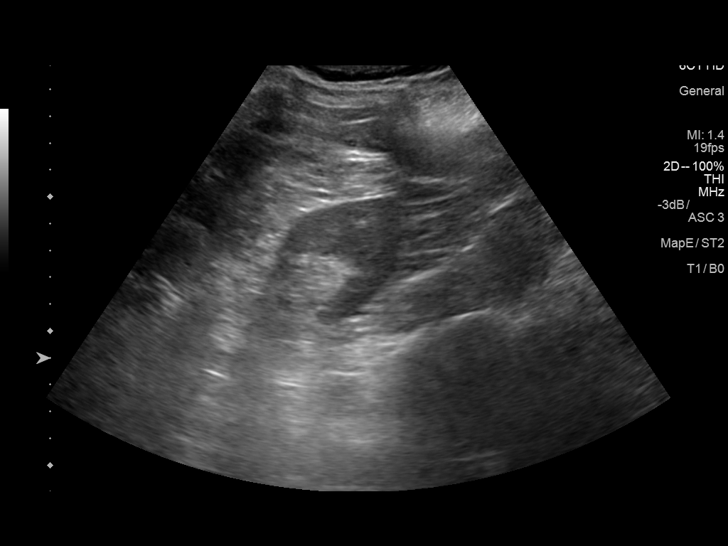
[im 75/75]
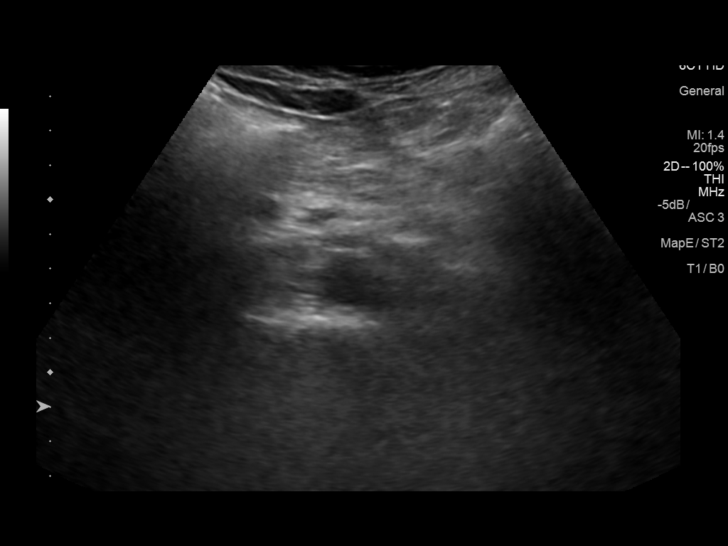

[14 of 25 positions shown; findings below may reference images not displayed]

FINDINGS: Gallbladder: No gallstones or wall thickening visualized. No
sonographic Murphy sign noted by sonographer.

Common bile duct: Diameter: 5 mm

Liver: No focal lesion identified. Within normal limits in
parenchymal echogenicity. Portal vein is patent on color Doppler
imaging with normal direction of blood flow towards the liver.

IVC: Not visualized below the liver. Normal appearance at the
hepatic cava

Pancreas: Visualized portion unremarkable.

Spleen: Size and appearance within normal limits.

Right Kidney: Length: 11 cm. Echogenicity within normal limits. No
mass or hydronephrosis visualized.

Left Kidney: Length: 11 cm. Echogenicity within normal limits. No
mass or hydronephrosis visualized.

Abdominal aorta: No aneurysm visualized.
IMPRESSION: Negative abdominal ultrasound.  No explanation for pain.

## 2022-11-29 ENCOUNTER — Other Ambulatory Visit: Payer: Self-pay

## 2022-11-29 ENCOUNTER — Encounter (HOSPITAL_COMMUNITY): Payer: Self-pay

## 2022-11-29 ENCOUNTER — Emergency Department (HOSPITAL_COMMUNITY)
Admission: EM | Admit: 2022-11-29 | Discharge: 2022-11-29 | Disposition: A | Discharge status: Home / Self Care | Payer: No Typology Code available for payment source | Attending: Emergency Medicine | Admitting: Emergency Medicine

## 2022-11-29 ENCOUNTER — Emergency Department (HOSPITAL_COMMUNITY): Payer: BLUE CROSS/BLUE SHIELD

## 2022-11-29 DIAGNOSIS — X509XXA Other and unspecified overexertion or strenuous movements or postures, initial encounter: Secondary | ICD-10-CM | POA: Insufficient documentation

## 2022-11-29 DIAGNOSIS — S63502A Unspecified sprain of left wrist, initial encounter: Secondary | ICD-10-CM | POA: Insufficient documentation

## 2022-11-29 DIAGNOSIS — S6992XA Unspecified injury of left wrist, hand and finger(s), initial encounter: Secondary | ICD-10-CM | POA: Diagnosis present
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
